# Patient Record
Sex: Male | Born: 1955 | Hispanic: No | Marital: Married | Smoking: Former smoker
Health system: Southern US, Community
[De-identification: ages and names within clinical notes are randomized; demographics above are authoritative.]

## PROBLEM LIST (undated history)

## (undated) DIAGNOSIS — Q5310 Unspecified undescended testicle, unilateral: Secondary | ICD-10-CM

## (undated) DIAGNOSIS — I10 Essential (primary) hypertension: Secondary | ICD-10-CM

## (undated) DIAGNOSIS — K4090 Unilateral inguinal hernia, without obstruction or gangrene, not specified as recurrent: Secondary | ICD-10-CM

## (undated) HISTORY — DX: Essential (primary) hypertension: I10

## (undated) HISTORY — PX: OTHER SURGICAL HISTORY: SHX169

---

## 2014-09-06 ENCOUNTER — Ambulatory Visit (INDEPENDENT_AMBULATORY_CARE_PROVIDER_SITE_OTHER): Payer: BLUE CROSS/BLUE SHIELD | Admitting: Internal Medicine

## 2014-09-06 ENCOUNTER — Ambulatory Visit (INDEPENDENT_AMBULATORY_CARE_PROVIDER_SITE_OTHER)
Admission: RE | Admit: 2014-09-06 | Discharge: 2014-09-06 | Disposition: A | Discharge status: Home / Self Care | Payer: BLUE CROSS/BLUE SHIELD | Source: Ambulatory Visit | Attending: Internal Medicine | Admitting: Internal Medicine

## 2014-09-06 ENCOUNTER — Encounter: Payer: Self-pay | Admitting: Internal Medicine

## 2014-09-06 VITALS — BP 130/82 | HR 81 | Ht 71.0 in | Wt 273.6 lb

## 2014-09-06 DIAGNOSIS — E669 Obesity, unspecified: Secondary | ICD-10-CM | POA: Diagnosis not present

## 2014-09-06 DIAGNOSIS — R0600 Dyspnea, unspecified: Secondary | ICD-10-CM

## 2014-09-06 NOTE — Patient Instructions (Signed)
Weight control is simply a matter of calorie balance which needs to be tilted in your favor by eating less and exercising more.  To get the most out of exercise, you need to be continuously aware that you are short of breath, but never out of breath, for 30 minutes daily. As you improve, it will actually be easier for you to do the same amount of exercise  in  30 minutes so always push to the level where you are short of breath.    Think of your calorie balance like you do your bank account where in this case you want the balance to go down so you must take in less calories than you burn up.  It's just that simple:  Hard to do, but easy to understand.  Good luck!   Please remember to go to the  x-ray department downstairs for your tests - we will call you with the results when they are available.    Please schedule a follow up office visit in 6 weeks, call sooner if needed with pfts.

## 2014-09-06 NOTE — Progress Notes (Signed)
   Subjective:    Patient ID: Gabriel Shea, male    DOB: 30-Dec-1955,   MRN: [id number]  HPI  59 yo PR male quit smoking 2002  At 89 and no problem with walking / steps with new onset variable sob/cough x summer 2015 self referred for pulmonary evaluation 09/06/2014   09/06/2014 1st [facility] office visit/ Gabriel Shea   Chief Complaint  Patient presents with  . Pulmonary Consult    Self referral. Pt c/o increased fatigue with intermittent SOB. Pt states that SOB happens at random times during the day. Pt also states non-prod cough during spells of SOB.   onset was indolent and pattern is persistent but quit sporadic occurs once a week but not necessarily related to intensiity of activity/ sometimes with dry cough and sometimes not but usually not present at rest or sleeping. Has already had stress test 3 m prior to OV  And did not reproduce and sob.     No obvious day to day or daytime variability or assoc classically pleuritic or ex cp or chest tightness, subjective wheeze or overt sinus or hb symptoms. No unusual exp hx or h/o childhood pna/ asthma or knowledge of premature birth.  Sleeping ok without nocturnal  or early am exacerbation  of respiratory  c/o's or need for noct saba. Also denies any obvious fluctuation of symptoms with weather or environmental changes or other aggravating or alleviating factors except as outlined above   Current Medications, Allergies, Complete Past Medical History, Past Surgical History, Family History, and Social History were reviewed in [employer] record.         Review of Systems  Constitutional: Positive for activity change and fatigue. Negative for fever, chills, appetite change and unexpected weight change.  HENT: Negative for congestion, dental problem, postnasal drip, rhinorrhea, sneezing, sore throat, trouble swallowing and voice change.   Eyes: Negative for visual disturbance.  Respiratory: Positive for shortness of  breath. Negative for cough and choking.   Cardiovascular: Negative for chest pain and leg swelling.  Gastrointestinal: Negative for nausea, vomiting and abdominal pain.  Genitourinary: Negative for difficulty urinating.  Musculoskeletal: Negative for arthralgias.  Skin: Negative for rash.  Psychiatric/Behavioral: Negative for behavioral problems and confusion.       Objective:   Physical Exam  amb pr male nad  Wt Readings from Last 3 Encounters:  09/06/14 273 lb 9.6 oz (124.104 kg)    Vital signs reviewed  HEENT: nl dentition, turbinates, and orophanx. Nl external ear canals without cough reflex   NECK :  without JVD/Nodes/TM/ nl carotid upstrokes bilaterally   LUNGS: no acc muscle use, clear to A and P bilaterally without cough on insp or exp maneuvers   CV:  RRR  no s3 or murmur or increase in P2, no edema   ABD:  soft and nontender with nl excursion in the supine position. No bruits or organomegaly, bowel sounds nl  MS:  warm without deformities, calf tenderness, cyanosis or clubbing  SKIN: warm and dry without lesions    NEURO:  alert, approp, no deficits    CXR PA and Lateral:   09/06/2014 :     I personally reviewed images and agree with radiology impression as follows:    Trachea is midline. Heart size normal. Lungs are clear. No pleural fluid. Prominent flowing anterior osteophytosis in the thoracic spine.     Assessment & Plan:

## 2014-09-07 ENCOUNTER — Encounter: Payer: Self-pay | Admitting: Internal Medicine

## 2014-09-07 ENCOUNTER — Institutional Professional Consult (permissible substitution): Payer: Self-pay | Admitting: Internal Medicine

## 2014-09-07 DIAGNOSIS — E669 Obesity, unspecified: Secondary | ICD-10-CM | POA: Insufficient documentation

## 2014-09-07 NOTE — Assessment & Plan Note (Signed)
09/06/2014  Walked RA x 3 laps @ 185 ft each stopped due to  End of study, nl pace, min sob but no desat    Symptoms are markedly disproportionate to objective findings and not clear this is a lung problem but pt does appear to have difficult airway management issues. DDX of  difficult airways management all start with A and  include Adherence, Ace Inhibitors, Acid Reflux, Active Sinus Disease, Alpha 1 Antitripsin deficiency, Anxiety masquerading as Airways dz,  ABPA,  allergy(esp in young), Aspiration (esp in elderly), Adverse effects of meds,  Active smokers, A bunch of PE's (a small clot burden can't cause this syndrome unless there is already severe underlying pulm or vascular dz with poor reserve) plus two Bs  = Bronchiectasis and Beta blocker use..and one C= CHF  ? Acid (or non-acid) GERD > always difficult to exclude as up to 75% of pts in some series report no assoc GI/ Heartburn symptoms>  Consider if problem more consistent >> trial of   max (24h)  acid suppression and diet restrictions    ? Allergy/ asthma > seem very unlikely though hard to exclude and was a smoker > needs pfts to complete the w/u  ? Anxiety related to wt gain/ deconditioning ( see obesity separate a/p)  ? Chf/ ihd > says recent neg heart w/u   I had an extended discussion with the patient reviewing all relevant studies completed to datex 69m   Each maintenance medication was reviewed in detail including most importantly the difference between maintenance and prns and under what circumstances the prns are to be triggered using an action plan format that is not reflected in the computer generated alphabetically organized AVS.    Please see instructions for details which were reviewed in writing and the patient given a copy highlighting the part that I personally wrote and discussed at today's ov.

## 2014-09-07 NOTE — Assessment & Plan Note (Signed)
Body mass index is 38.18 kg/(m^2).  No results found for: TSH   Contributing to gerd tendency/ doe/reviewed need  achieve and maintain neg calorie balance > defer f/u primary care including intermittently monitoring thyroid status

## 2014-10-20 ENCOUNTER — Ambulatory Visit: Payer: BLUE CROSS/BLUE SHIELD | Admitting: Internal Medicine

## 2016-04-17 IMAGING — CR DG CHEST 2V
2 series · 2 of 2 positions shown · non-contrast
Comparison: None.

CLINICAL DATA: Shortness of breath on exertion, ex smoker.

EXAM:
CHEST  2 VIEW

[view not recorded (1 of 2)]
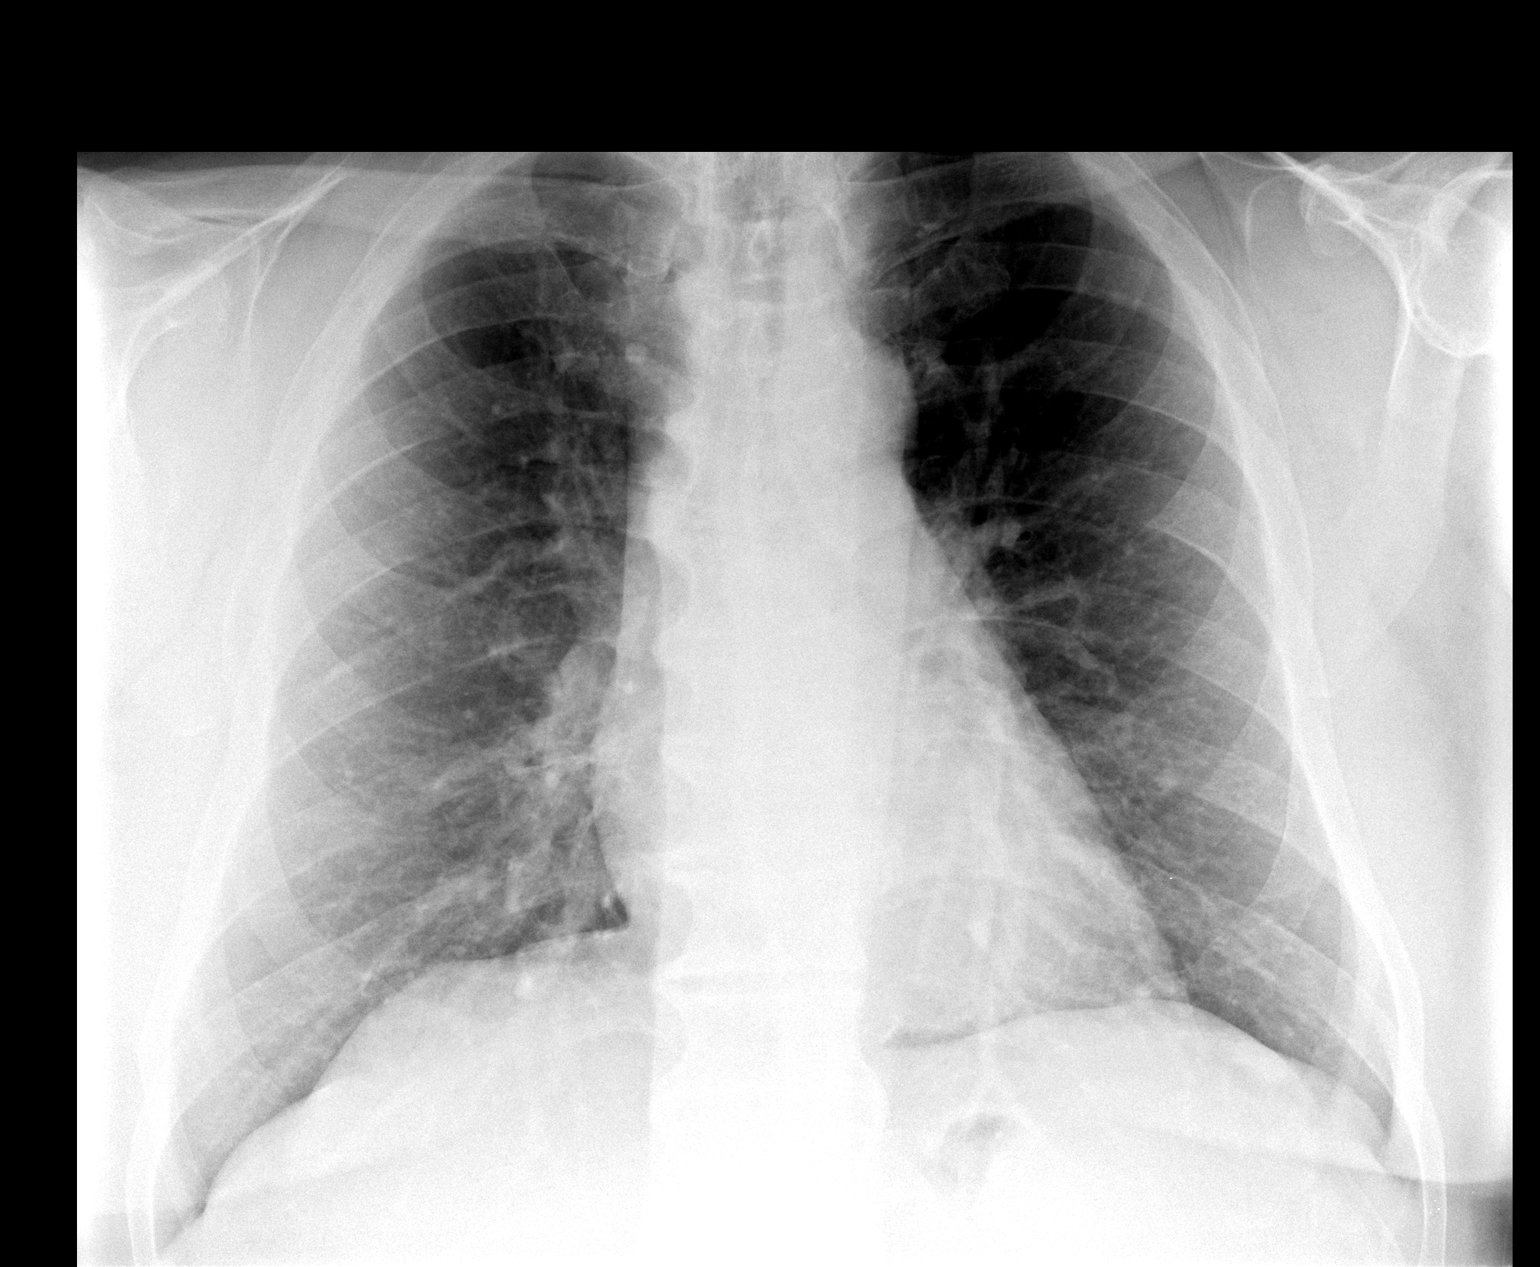

[view not recorded (2 of 2)]
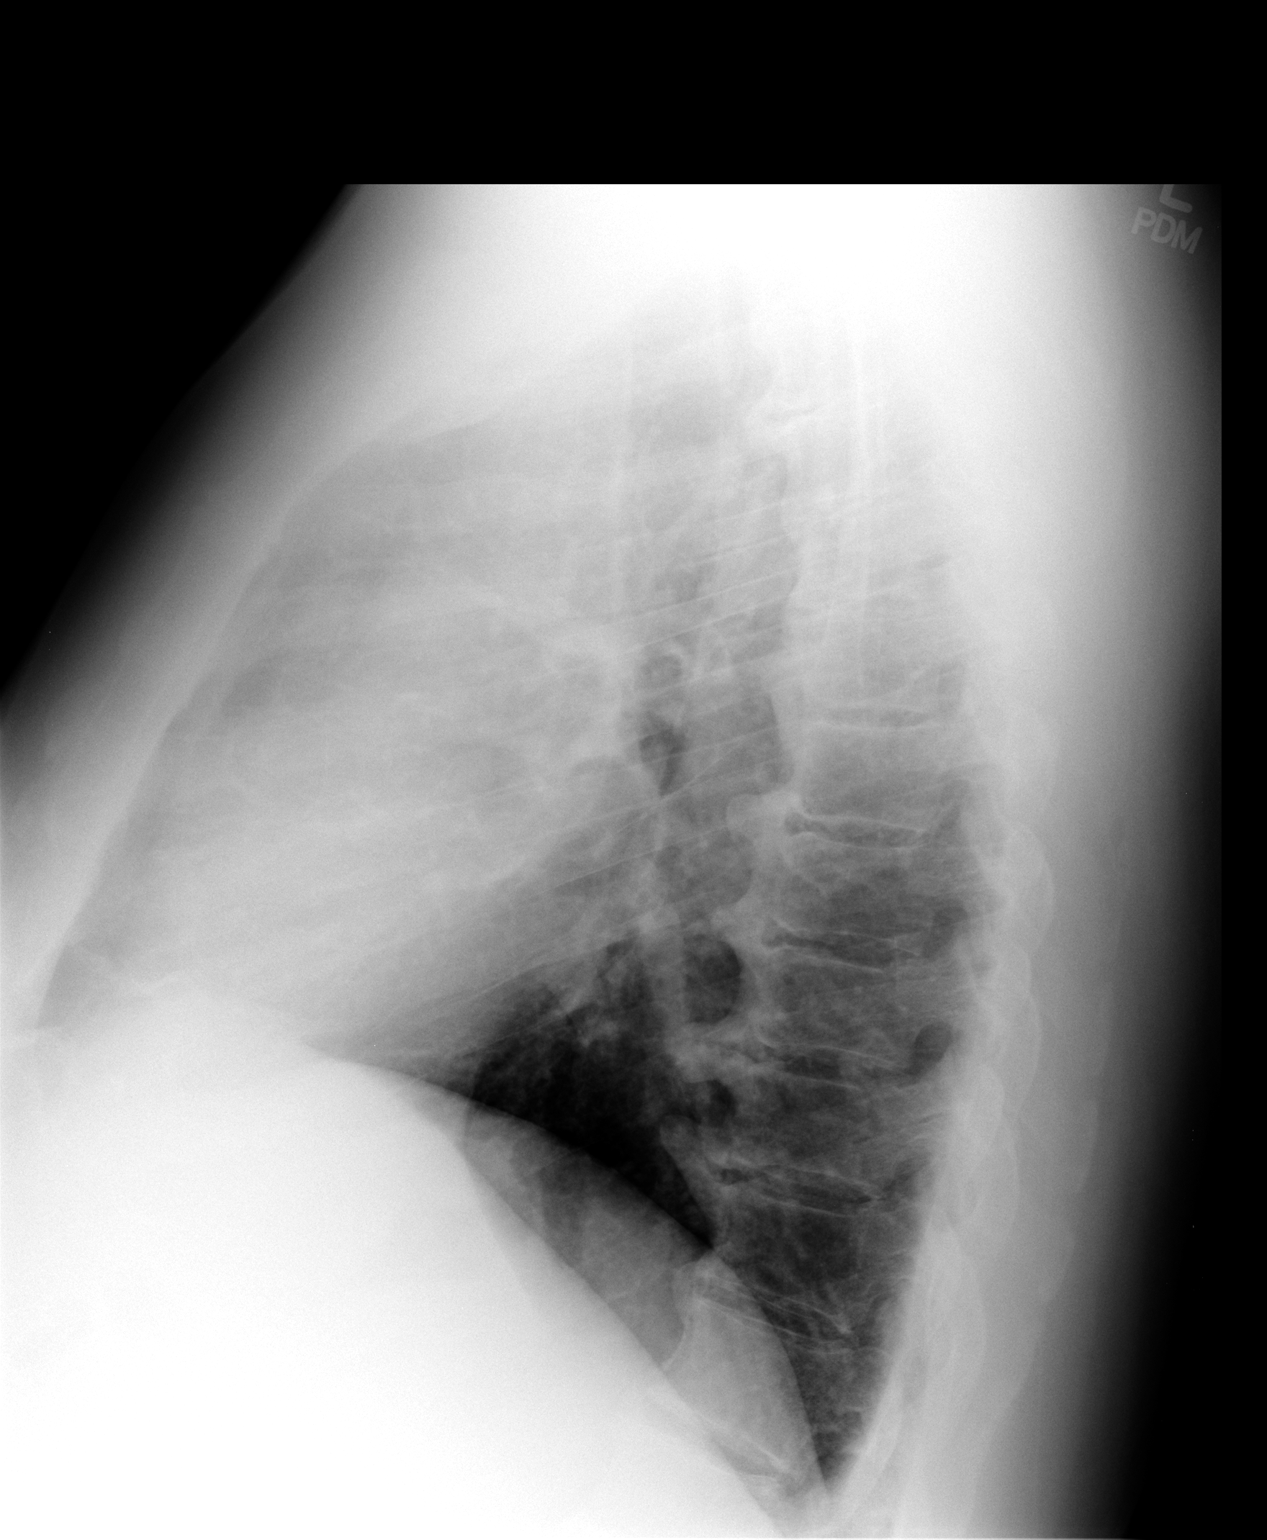

[2 of 2 positions shown; findings below may reference images not displayed]

FINDINGS: Trachea is midline. Heart size normal. Lungs are clear. No pleural
fluid. Prominent flowing anterior osteophytosis in the thoracic
spine.
IMPRESSION: No acute findings.

## 2017-06-23 ENCOUNTER — Emergency Department (HOSPITAL_BASED_OUTPATIENT_CLINIC_OR_DEPARTMENT_OTHER)
Admission: EM | Admit: 2017-06-23 | Discharge: 2017-06-24 | Disposition: A | Discharge status: Home / Self Care | Payer: BLUE CROSS/BLUE SHIELD | Attending: Emergency Medicine | Admitting: Emergency Medicine

## 2017-06-23 ENCOUNTER — Other Ambulatory Visit: Payer: Self-pay

## 2017-06-23 ENCOUNTER — Encounter (HOSPITAL_BASED_OUTPATIENT_CLINIC_OR_DEPARTMENT_OTHER): Payer: Self-pay | Admitting: *Deleted

## 2017-06-23 ENCOUNTER — Emergency Department (HOSPITAL_BASED_OUTPATIENT_CLINIC_OR_DEPARTMENT_OTHER): Payer: BLUE CROSS/BLUE SHIELD

## 2017-06-23 DIAGNOSIS — Z87891 Personal history of nicotine dependence: Secondary | ICD-10-CM | POA: Insufficient documentation

## 2017-06-23 DIAGNOSIS — Z79899 Other long term (current) drug therapy: Secondary | ICD-10-CM | POA: Insufficient documentation

## 2017-06-23 DIAGNOSIS — I10 Essential (primary) hypertension: Secondary | ICD-10-CM | POA: Insufficient documentation

## 2017-06-23 DIAGNOSIS — R1032 Left lower quadrant pain: Secondary | ICD-10-CM | POA: Diagnosis present

## 2017-06-23 DIAGNOSIS — K4090 Unilateral inguinal hernia, without obstruction or gangrene, not specified as recurrent: Secondary | ICD-10-CM | POA: Diagnosis not present

## 2017-06-23 HISTORY — DX: Unspecified undescended testicle, unilateral: Q53.10

## 2017-06-23 LAB — CBC WITH DIFFERENTIAL/PLATELET
BASOS PCT: 1 %
Basophils Absolute: 0 10*3/uL (ref 0.0–0.1)
Eosinophils Absolute: 0.2 10*3/uL (ref 0.0–0.7)
Eosinophils Relative: 3 %
HEMATOCRIT: 43.1 % (ref 39.0–52.0)
HEMOGLOBIN: 15.3 g/dL (ref 13.0–17.0)
Lymphocytes Relative: 40 %
Lymphs Abs: 2.4 10*3/uL (ref 0.7–4.0)
MCH: 30.1 pg (ref 26.0–34.0)
MCHC: 35.5 g/dL (ref 30.0–36.0)
MCV: 84.7 fL (ref 78.0–100.0)
Monocytes Absolute: 0.8 10*3/uL (ref 0.1–1.0)
Monocytes Relative: 13 %
NEUTROS ABS: 2.5 10*3/uL (ref 1.7–7.7)
NEUTROS PCT: 43 %
Platelets: 285 10*3/uL (ref 150–400)
RBC: 5.09 MIL/uL (ref 4.22–5.81)
RDW: 12.5 % (ref 11.5–15.5)
WBC: 5.9 10*3/uL (ref 4.0–10.5)

## 2017-06-23 LAB — URINALYSIS, ROUTINE W REFLEX MICROSCOPIC
Bilirubin Urine: NEGATIVE
Glucose, UA: NEGATIVE mg/dL
Hgb urine dipstick: NEGATIVE
KETONES UR: NEGATIVE mg/dL
LEUKOCYTES UA: NEGATIVE
NITRITE: NEGATIVE
Protein, ur: NEGATIVE mg/dL
pH: 6 (ref 5.0–8.0)

## 2017-06-23 LAB — COMPREHENSIVE METABOLIC PANEL
ALK PHOS: 118 U/L (ref 38–126)
ALT: 29 U/L (ref 17–63)
AST: 26 U/L (ref 15–41)
Albumin: 4.1 g/dL (ref 3.5–5.0)
Anion gap: 6 (ref 5–15)
BILIRUBIN TOTAL: 0.4 mg/dL (ref 0.3–1.2)
BUN: 20 mg/dL (ref 6–20)
CALCIUM: 8.8 mg/dL — AB (ref 8.9–10.3)
CO2: 26 mmol/L (ref 22–32)
Chloride: 104 mmol/L (ref 101–111)
Creatinine, Ser: 1.13 mg/dL (ref 0.61–1.24)
GFR calc Af Amer: >60 mL/min (ref >60)
GFR calc non Af Amer: >60 mL/min (ref >60)
GLUCOSE: 191 mg/dL — AB (ref 65–99)
Potassium: 3.8 mmol/L (ref 3.5–5.1)
Sodium: 136 mmol/L (ref 135–145)
TOTAL PROTEIN: 7.4 g/dL (ref 6.5–8.1)

## 2017-06-23 LAB — LIPASE, BLOOD: Lipase: 27 U/L (ref 11–51)

## 2017-06-23 MED ORDER — IOPAMIDOL (ISOVUE-300) INJECTION 61%
100.0000 mL | Freq: Once | INTRAVENOUS | Status: AC | PRN
  Administered 2017-06-23: 100 mL via INTRAVENOUS

## 2017-06-23 NOTE — ED Provider Notes (Signed)
[facility] Provider Note: Lowella Dell, MD, FACEP  CSN: [id number] MRN: [id number] ARRIVAL: 06/23/17 at 2101 ROOM: [facility]03/[facility]03   CHIEF COMPLAINT  Abdominal Pain   HISTORY OF PRESENT ILLNESS  06/23/17 11:00 PM Gabriel Shea is a 62 y.o. male with several days of left inguinal pain.  The pain is intermittent and sharp.  It has been severe at times.  It is not present at night when and bed supine but does occur intermittently throughout the day, particularly when active.  He is also felt a small mass associated with this at times.  He states the pain is fleeting only lasting a few seconds.  There is been no associated nausea, vomiting, diarrhea or constipation.  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   Past Medical History:  Diagnosis Date  . Hypertension   . Undescended right testicle     Past Surgical History:  Procedure Laterality Date  . orchiopexy Right     Family History  Problem Relation Age of Onset  . Emphysema Maternal Grandmother   . Throat cancer Paternal Grandfather   . Prostate cancer Paternal Grandfather     Social History   Tobacco Use  . Smoking status: Former Smoker    Packs/day: 1.00    Years: 25.00    Pack years: 25.00    Types: Cigarettes    Last attempt to quit: 09/05/2000    Years since quitting: 16.8  . Smokeless tobacco: Never Used  Substance Use Topics  . Alcohol use: Yes    Alcohol/week: 0.0 oz    Frequency: Never  . Drug use: Never    Prior to Admission medications   Medication Sig Start Date End Date Taking? Authorizing Provider  losartan (COZAAR) 25 MG tablet Take 25 mg by mouth daily. 08/06/14  Yes [provider]    Allergies Patient has no known allergies.   REVIEW OF SYSTEMS  Negative except as noted here or in the History of Present Illness.   PHYSICAL EXAMINATION  Initial Vital Signs Blood pressure (!) 179/80, pulse 89, temperature 98.2 F (36.8 C), temperature  source Oral, resp. rate 19, height 5\' 11"  (1.803 m), weight 124.3 kg (274 lb), SpO2 97 %.  Examination General: Well-developed, well-nourished male in no acute distress; appearance consistent with age of record HENT: normocephalic; atraumatic Eyes: pupils equal, round and reactive to light; extraocular muscles intact Neck: supple Heart: regular rate and rhythm Lungs: clear to auscultation bilaterally Abdomen: soft; nondistended; nontender; no masses or hepatosplenomegaly; bowel sounds present GU: Tanner V male, uncircumcised; testes descended bilaterally; asymmetry of groin folds, right greater than left Extremities: No deformity; full range of motion; pulses normal Neurologic: Awake, alert and oriented; motor function intact in all extremities and symmetric; no facial droop Skin: Warm and dry Psychiatric: Normal mood and affect   RESULTS  Summary of this visit's results, reviewed by myself:   EKG Interpretation  Date/Time:    Ventricular Rate:    PR Interval:    QRS Duration:   QT Interval:    QTC Calculation:   R Axis:     Text Interpretation:        Laboratory Studies: Results for orders placed or performed during the hospital encounter of 06/23/17 (from the past 24 hour(s))  Urinalysis, Routine w reflex microscopic     Status: Abnormal   Collection Time: 06/23/17  9:10 PM  Result Value Ref Range   Color, Urine YELLOW YELLOW   APPearance CLEAR  CLEAR   Specific Gravity, Urine >1.030 (H) 1.005 - 1.030   pH 6.0 5.0 - 8.0   Glucose, UA NEGATIVE NEGATIVE mg/dL   Hgb urine dipstick NEGATIVE NEGATIVE   Bilirubin Urine NEGATIVE NEGATIVE   Ketones, ur NEGATIVE NEGATIVE mg/dL   Protein, ur NEGATIVE NEGATIVE mg/dL   Nitrite NEGATIVE NEGATIVE   Leukocytes, UA NEGATIVE NEGATIVE  CBC with Differential     Status: None   Collection Time: 06/23/17  9:38 PM  Result Value Ref Range   WBC 5.9 4.0 - 10.5 K/uL   RBC 5.09 4.22 - 5.81 MIL/uL   Hemoglobin 15.3 13.0 - 17.0 g/dL   HCT  11.943.1 14.739.0 - 82.952.0 %   MCV 84.7 78.0 - 100.0 fL   MCH 30.1 26.0 - 34.0 pg   MCHC 35.5 30.0 - 36.0 g/dL   RDW 56.212.5 13.011.5 - 86.515.5 %   Platelets 285 150 - 400 K/uL   Neutrophils Relative % 43 %   Neutro Abs 2.5 1.7 - 7.7 K/uL   Lymphocytes Relative 40 %   Lymphs Abs 2.4 0.7 - 4.0 K/uL   Monocytes Relative 13 %   Monocytes Absolute 0.8 0.1 - 1.0 K/uL   Eosinophils Relative 3 %   Eosinophils Absolute 0.2 0.0 - 0.7 K/uL   Basophils Relative 1 %   Basophils Absolute 0.0 0.0 - 0.1 K/uL  Lipase, blood     Status: None   Collection Time: 06/23/17  9:38 PM  Result Value Ref Range   Lipase 27 11 - 51 U/L  Comprehensive metabolic panel     Status: Abnormal   Collection Time: 06/23/17  9:38 PM  Result Value Ref Range   Sodium 136 135 - 145 mmol/L   Potassium 3.8 3.5 - 5.1 mmol/L   Chloride 104 101 - 111 mmol/L   CO2 26 22 - 32 mmol/L   Glucose, Bld 191 (H) 65 - 99 mg/dL   BUN 20 6 - 20 mg/dL   Creatinine, Ser 7.841.13 0.61 - 1.24 mg/dL   Calcium 8.8 (L) 8.9 - 10.3 mg/dL   Total Protein 7.4 6.5 - 8.1 g/dL   Albumin 4.1 3.5 - 5.0 g/dL   AST 26 15 - 41 U/L   ALT 29 17 - 63 U/L   Alkaline Phosphatase 118 38 - 126 U/L   Total Bilirubin 0.4 0.3 - 1.2 mg/dL   GFR calc non Af Amer >60 >60 mL/min   GFR calc Af Amer >60 >60 mL/min   Anion gap 6 5 - 15   Imaging Studies: Ct Abdomen Pelvis W Contrast  Result Date: 06/23/2017 CLINICAL DATA:  Intermittent left growing pain for 1-2 weeks. EXAM: CT ABDOMEN AND PELVIS WITH CONTRAST TECHNIQUE: Multidetector CT imaging of the abdomen and pelvis was performed using the standard protocol following bolus administration of intravenous contrast. CONTRAST:  100mL ISOVUE-300 IOPAMIDOL (ISOVUE-300) INJECTION 61% COMPARISON:  None. FINDINGS: Lower chest: No acute abnormality.  Small hiatal hernia. Hepatobiliary: Mild hepatic steatosis. Normal appearance of the gallbladder. Pancreas: Unremarkable. No pancreatic ductal dilatation or surrounding inflammatory changes. Spleen:  Normal in size without focal abnormality. Adrenals/Urinary Tract: Adrenal glands are unremarkable. Kidneys are normal, without renal calculi, focal lesion, or hydronephrosis. Bladder is unremarkable. Stomach/Bowel: Stomach is within normal limits. Appendix appears normal. No evidence of bowel wall thickening, distention, or inflammatory changes. Vascular/Lymphatic: Mild calcific atherosclerotic disease of the aorta. No evidence of lymphadenopathy. Reproductive: Prostate is unremarkable. Other: Fat containing left inguinal hernia. Musculoskeletal: Thoraco lumbar spine spondylosis. IMPRESSION: Fat  containing left inguinal hernia. Small hiatal hernia. Mild hepatic steatosis. Electronically Signed   By: Ted Mcalpine M.D.   On: 06/23/2017 23:52    ED COURSE and MDM  Nursing notes and initial vitals signs, including pulse oximetry, reviewed.  Vitals:   06/23/17 2106 06/23/17 2107  BP:  (!) 179/80  Pulse:  89  Resp:  19  Temp:  98.2 F (36.8 C)  TempSrc:  Oral  SpO2:  97%  Weight: 124.3 kg (274 lb)   Height: 5\' 11"  (1.803 m)    Patient advised of CT findings.  His hernia is unlikely to require surgery but if symptoms worsen we will referred him to [facility] surgery.  PROCEDURES    ED DIAGNOSES     ICD-10-CM   1. Inguinal hernia of left side without obstruction or gangrene K40.90        Rilyn Scroggs, MD 06/24/17 0010

## 2017-06-23 NOTE — ED Triage Notes (Signed)
Lower left abdominal pain on and off for months.

## 2018-05-21 ENCOUNTER — Emergency Department (HOSPITAL_BASED_OUTPATIENT_CLINIC_OR_DEPARTMENT_OTHER)
Admission: EM | Admit: 2018-05-21 | Discharge: 2018-05-21 | Disposition: A | Discharge status: Home / Self Care | Payer: BLUE CROSS/BLUE SHIELD | Attending: Emergency Medicine | Admitting: Emergency Medicine

## 2018-05-21 ENCOUNTER — Encounter (HOSPITAL_BASED_OUTPATIENT_CLINIC_OR_DEPARTMENT_OTHER): Payer: Self-pay | Admitting: Emergency Medicine

## 2018-05-21 ENCOUNTER — Other Ambulatory Visit: Payer: Self-pay

## 2018-05-21 DIAGNOSIS — I10 Essential (primary) hypertension: Secondary | ICD-10-CM

## 2018-05-21 HISTORY — DX: Unilateral inguinal hernia, without obstruction or gangrene, not specified as recurrent: K40.90

## 2018-05-21 MED ORDER — AMLODIPINE BESYLATE 5 MG PO TABS: 5.0000 mg | ORAL_TABLET | Freq: Every day | ORAL | 0 refills | Status: AC

## 2018-05-21 NOTE — ED Notes (Signed)
Pt on monitor 

## 2018-05-21 NOTE — ED Notes (Signed)
ED Provider at bedside. 

## 2018-05-21 NOTE — ED Triage Notes (Signed)
Pt takes bp med and monitors his bp at home.  For 5 days is has been elevated despite taking his bp med. No physical symptoms.

## 2018-05-21 NOTE — Discharge Instructions (Addendum)
I am increasing your amlodipine. You are already on a combination pill (amlodipine-valsartan). I want you to take an additional 5 mg of amlodipine with it. I want you to keep a log of your blood pressure. This is the best way for [place] to know how to adjust your medications. I want you to make an appointment to follow-up with your PCP in a few weeks.

## 2018-05-23 NOTE — ED Provider Notes (Signed)
[facility] EMERGENCY DEPARTMENT Provider Note   CSN: [id number] Arrival date & time: 05/21/18  1211    History   Chief Complaint Chief Complaint  Patient presents with  . Hypertension    HPI Gabriel Shea is a 63 y.o. male.   HPI   63 year old male with hypertension.  Asymptomatic.  He reports that he has been taking his blood pressure the past several days and has been consistently very elevated.  He reports compliance with all his medications.  He denies any recent changes.  No acute pain, dyspnea, swelling, etc.  Past Medical History:  Diagnosis Date  . Hypertension   . Inguinal hernia   . Undescended right testicle     Patient Active Problem List   Diagnosis Date Noted  . Obesity 09/07/2014  . Dyspnea 09/06/2014    Past Surgical History:  Procedure Laterality Date  . orchiopexy Right         Home Medications    Prior to Admission medications   Medication Sig Start Date End Date Taking? Authorizing Provider  amLODipine-valsartan (EXFORGE) 5-160 MG tablet Take by mouth. 07/10/16  Yes [provider]  metoprolol succinate (TOPROL-XL) 100 MG 24 hr tablet Take by mouth. 04/05/18  Yes [provider]  amLODipine (NORVASC) 5 MG tablet Take 1 tablet (5 mg total) by mouth daily. 05/21/18   Raeford RazorKohut, Aidel Davisson, MD  losartan (COZAAR) 25 MG tablet Take 25 mg by mouth daily. 08/06/14   [provider]    Family History Family History  Problem Relation Age of Onset  . Emphysema Maternal Grandmother   . Throat cancer Paternal Grandfather   . Prostate cancer Paternal Grandfather     Social History Social History   Tobacco Use  . Smoking status: Former Smoker    Packs/day: 1.00    Years: 25.00    Pack years: 25.00    Types: Cigarettes    Last attempt to quit: 09/05/2000    Years since quitting: 17.7  . Smokeless tobacco: Never Used  Substance Use Topics  . Alcohol use: Yes    Alcohol/week: 0.0 standard drinks    Frequency: Never     Comment: occ  . Drug use: Never     Allergies   Patient has no known allergies.   Review of Systems Review of Systems  All systems reviewed and negative, other than as noted in HPI.  Physical Exam Updated Vital Signs BP (!) 148/78   Pulse 83   Temp 98.5 F (36.9 C) (Oral)   Resp (!) 21   Ht 5\' 11"  (1.803 m)   Wt 120.7 kg   SpO2 100%   BMI 37.10 kg/m   Physical Exam Vitals signs and nursing note reviewed.  Constitutional:      General: He is not in acute distress.    Appearance: He is well-developed. He is obese.  HENT:     Head: Normocephalic and atraumatic.  Eyes:     General:        Right eye: No discharge.        Left eye: No discharge.     Conjunctiva/sclera: Conjunctivae normal.  Neck:     Musculoskeletal: Neck supple.  Cardiovascular:     Rate and Rhythm: Normal rate and regular rhythm.     Heart sounds: Normal heart sounds. No murmur. No friction rub. No gallop.   Pulmonary:     Effort: Pulmonary effort is normal. No respiratory distress.     Breath sounds: Normal breath  sounds.  Abdominal:     General: There is no distension.     Palpations: Abdomen is soft.     Tenderness: There is no abdominal tenderness.  Musculoskeletal:        General: No tenderness.  Skin:    General: Skin is warm and dry.  Neurological:     Mental Status: He is alert.  Psychiatric:        Behavior: Behavior normal.        Thought Content: Thought content normal.      ED Treatments / Results  Labs (all labs ordered are listed, but only abnormal results are displayed) Labs Reviewed - No data to display  EKG EKG Interpretation  Date/Time:  Friday May 21 2018 12:38:57 EDT Ventricular Rate:  90 PR Interval:    QRS Duration: 88 QT Interval:  347 QTC Calculation: 425 R Axis:   64 Text Interpretation:  Sinus rhythm Confirmed by Raeford Razor [phone]) on 05/21/2018 12:54:04 PM   Radiology No results found.  Procedures Procedures (including critical care  time)  Medications Ordered in ED Medications - No data to display   Initial Impression / Assessment and Plan / ED Course  I have reviewed the triage vital signs and the nursing notes.  Pertinent labs & imaging results that were available during my care of the patient were reviewed by me and considered in my medical decision making (see chart for details).     63 year old male with asymptomatic hypertension.  Small increase in his medicine with amlodipine going from 5 mg to 10 mg.  Advised to keep a consistent log of his blood pressure as this is the best way for [place] know how to titrate his medications appropriately.  Return precautions were discussed.  I would like him to follow-up with his PCP otherwise and take his blood pressure log with him.  Final Clinical Impressions(s) / ED Diagnoses   Final diagnoses:  Essential hypertension    ED Discharge Orders         Ordered    amLODipine (NORVASC) 5 MG tablet  Daily     05/21/18 1258           Raeford Razor, MD 05/23/18 [phone]

## 2021-09-20 DEATH — deceased
